# Patient Record
Sex: Female | Born: 1975 | Race: White | Hispanic: No | Marital: Married | Smoking: Former smoker
Health system: Southern US, Community
[De-identification: ages and names within clinical notes are randomized; demographics above are authoritative.]

## PROBLEM LIST (undated history)

## (undated) HISTORY — PX: OTHER SURGICAL HISTORY: SHX169

---

## 2013-02-08 ENCOUNTER — Emergency Department (HOSPITAL_COMMUNITY)
Admission: EM | Admit: 2013-02-08 | Discharge: 2013-02-08 | Disposition: A | Discharge status: Nursing Home (Includes ICF) | Payer: 59 | Source: Home / Self Care | Attending: Family Medicine | Admitting: Family Medicine

## 2013-02-08 ENCOUNTER — Emergency Department (HOSPITAL_COMMUNITY): Payer: 59

## 2013-02-08 ENCOUNTER — Encounter (HOSPITAL_COMMUNITY): Payer: Self-pay | Admitting: Vascular Surgery

## 2013-02-08 ENCOUNTER — Emergency Department (HOSPITAL_COMMUNITY)
Admission: EM | Admit: 2013-02-08 | Discharge: 2013-02-08 | Disposition: A | Discharge status: Home / Self Care | Payer: 59 | Attending: Emergency Medicine | Admitting: Emergency Medicine

## 2013-02-08 ENCOUNTER — Encounter (HOSPITAL_COMMUNITY): Payer: Self-pay | Admitting: Emergency Medicine

## 2013-02-08 DIAGNOSIS — Z79899 Other long term (current) drug therapy: Secondary | ICD-10-CM | POA: Insufficient documentation

## 2013-02-08 DIAGNOSIS — R0789 Other chest pain: Secondary | ICD-10-CM | POA: Insufficient documentation

## 2013-02-08 DIAGNOSIS — R079 Chest pain, unspecified: Secondary | ICD-10-CM

## 2013-02-08 DIAGNOSIS — R209 Unspecified disturbances of skin sensation: Secondary | ICD-10-CM | POA: Insufficient documentation

## 2013-02-08 DIAGNOSIS — Z87891 Personal history of nicotine dependence: Secondary | ICD-10-CM | POA: Insufficient documentation

## 2013-02-08 DIAGNOSIS — Z7982 Long term (current) use of aspirin: Secondary | ICD-10-CM | POA: Insufficient documentation

## 2013-02-08 LAB — POCT I-STAT TROPONIN I: Troponin i, poc: 0 ng/mL (ref 0.00–0.08)

## 2013-02-08 LAB — CBC WITH DIFFERENTIAL/PLATELET
Basophils Absolute: 0 10*3/uL (ref 0.0–0.1)
HCT: 35.7 % — ABNORMAL LOW (ref 36.0–46.0)
Lymphocytes Relative: 24 % (ref 12–46)
Monocytes Absolute: 0.4 10*3/uL (ref 0.1–1.0)
Neutro Abs: 4 10*3/uL (ref 1.7–7.7)
Platelets: 165 10*3/uL (ref 150–400)
RBC: 3.91 MIL/uL (ref 3.87–5.11)
RDW: 12.7 % (ref 11.5–15.5)
WBC: 5.9 10*3/uL (ref 4.0–10.5)

## 2013-02-08 LAB — BASIC METABOLIC PANEL
CO2: 23 mEq/L (ref 19–32)
Chloride: 102 mEq/L (ref 96–112)
Sodium: 137 mEq/L (ref 135–145)

## 2013-02-08 MED ORDER — GI COCKTAIL ~~LOC~~
30.0000 mL | Freq: Once | ORAL | Status: AC
  Administered 2013-02-08: 30 mL via ORAL
  Filled 2013-02-08: qty 30

## 2013-02-08 NOTE — ED Notes (Signed)
C/o chest pain and sob.  Onset of chest pain and sob this am, noted as soon as she woke, and continued when she got up.  Points to center chest as location of pain.  Reports pain as heavy and tight.  No nausea, no vomiting.  Pain in chest is constant and worse with deep inspiration.  Right arm numbness comes and goes.  Ibuprofen helped , but not complete relief.  Feels "sob" is more of a feeling she cannot get a deep breath.

## 2013-02-08 NOTE — ED Notes (Signed)
Pt reports to the ED for eval of midsternal chest heaviness. Pt reports some associated right arm numbness. Pt also SOB. Pt denies any N/V, diaphoresis, lightheadedness, or dizziness. Pt reports the pain woke her up from sleeping. Pt reports she has had a lot of stress in her life but is unsure if it is related. Pt from [facility]. Pt in NSR at this time and speaking in full sentences. Pt A&O x4. Pt denies any cough, fevers, or chills. Pt reports tightness and tension in her neck x 1 week.

## 2013-02-08 NOTE — ED Provider Notes (Signed)
CSN: [id number]     Arrival date & time 02/08/13  1543 History   First MD Initiated Contact with Patient 02/08/13 1550     Chief Complaint  Patient presents with  . Chest Pain   (Consider location/radiation/quality/duration/timing/severity/associated sxs/prior Treatment) HPI Comments: Desiree Kelly is a 37 y.o. female who presents for evaluation of chest heaviness that started today. She also has intermittent numbness in the right arm. She has been treating herself for "GERD" for several weeks. Today, she took an extra Prevacid tablet, without relief of the discomfort. She denies associated weakness, dizziness, nausea, vomiting, shortness of breath, back pain or difficulty walking. There's been no anorexia. She has not had this problem previously. She denies stress or depression. There are no other known modifying factors.   Patient is a 37 y.o. female presenting with chest pain. The history is provided by the patient.  Chest Pain   History reviewed. No pertinent past medical history. Past Surgical History  Procedure Laterality Date  . Tubes tied     Family History  Problem Relation Age of Onset  . Heart attack Mother    History  Substance Use Topics  . Smoking status: Former Games developer  . Smokeless tobacco: Not on file  . Alcohol Use: No   OB History   Grav Para Term Preterm Abortions TAB SAB Ect Mult Living                 Review of Systems  Cardiovascular: Positive for chest pain.  All other systems reviewed and are negative.    Allergies  Review of patient's allergies indicates no known allergies.  Home Medications   Current Outpatient Rx  Name  Route  Sig  Dispense  Refill  . aspirin 81 MG tablet   Oral   Take 162 mg by mouth once.          Marland Kitchen aspirin EC 325 MG tablet   Oral   Take 325 mg by mouth once.         . Biotin 1000 MCG tablet   Oral   Take 1,000 mcg by mouth daily.         . cetirizine (ZYRTEC) 10 MG tablet   Oral   Take 10 mg by  mouth daily.         . lansoprazole (PREVACID) 15 MG capsule   Oral   Take 15 mg by mouth daily.         Marland Kitchen loratadine (CLARITIN) 10 MG tablet   Oral   Take 10 mg by mouth once as needed for allergies. For allergy symptoms         . Prenatal Vit-Fe Fumarate-FA (PRENATAL PO)   Oral   Take 1 tablet by mouth daily.           BP 113/77  Pulse 74  Temp(Src) 98.1 F (36.7 C) (Oral)  Resp 15  SpO2 100%  LMP 01/15/2013 Physical Exam  Nursing note and vitals reviewed. Constitutional: She is oriented to person, place, and time. She appears well-developed and well-nourished.  HENT:  Head: Normocephalic and atraumatic.  Eyes: Conjunctivae and EOM are normal. Pupils are equal, round, and reactive to light.  Neck: Normal range of motion and phonation normal. Neck supple.  Cardiovascular: Normal rate, regular rhythm and intact distal pulses.   Pulmonary/Chest: Effort normal and breath sounds normal. She has no wheezes. She has no rales. She exhibits no tenderness.  Abdominal: Soft. She exhibits no distension. There is no  tenderness. There is no guarding.  Musculoskeletal: Normal range of motion.  Neurological: She is alert and oriented to person, place, and time. She has normal strength. She exhibits normal muscle tone.  Skin: Skin is warm and dry.  Psychiatric: She has a normal mood and affect. Her behavior is normal. Judgment and thought content normal.    ED Course  Procedures (including critical care time)  Medications  gi cocktail (Maalox,Lidocaine,Donnatal) (30 mLs Oral Given 02/08/13 1745)    Patient Vitals for the past 24 hrs:  BP Temp Temp src Pulse Resp SpO2  02/08/13 1830 113/77 mmHg - - 74 15 100 %  02/08/13 1747 123/85 mmHg - - 75 13 100 %  02/08/13 1630 - - - 70 15 100 %  02/08/13 1601 131/75 mmHg 98.1 F (36.7 C) Oral 83 18 -  02/08/13 1548 132/88 mmHg 97.8 F (36.6 C) Oral 78 16 100 %    6:55 PM Reevaluation with update and discussion. After initial  assessment and treatment, an updated evaluation reveals she reports transient improvement with the GI cocktail, and otherwise has no further complaints. [name]   Labs Review Labs Reviewed  CBC WITH DIFFERENTIAL - Abnormal; Notable for the following:    HCT 35.7 (*)    All other components within normal limits  BASIC METABOLIC PANEL  POCT I-STAT TROPONIN I   Imaging Review Dg Chest 2 View  02/08/2013   *RADIOLOGY REPORT*  Clinical Data: Chest pain  CHEST - 2 VIEW  Comparison: None.  Findings: Cardiomediastinal silhouette appears normal.  No acute pulmonary disease is noted.  Bony thorax is intact.  IMPRESSION: No acute cardiopulmonary abnormality seen.   Original Report Authenticated By: Lupita Raider.,  M.D.    MDM   1. Chest pain    Nonspecific chest discomfort, with negative EGD evaluation. She is PERC negative. Doubt ACS, PE, or pneumonia. She is stable for discharge to outpatient management.  Nursing Notes Reviewed/ Care Coordinated, and agree without changes. Applicable Imaging Reviewed.  Interpretation of Laboratory Data incorporated into ED treatment   Plan: Home Medications- PPI regularly; Home Treatments and Observation- Rest; return here if the recommended treatment, does not improve the symptoms; Recommended follow up- PCP of choice 2-3 weeks      Flint Melter, MD 02/08/13 [phone]

## 2013-02-08 NOTE — ED Provider Notes (Signed)
CSN: [id number]     Arrival date & time 02/08/13  1423 History   First MD Initiated Contact with Patient 02/08/13 1458     Chief Complaint  Patient presents with  . Chest Pain   (Consider location/radiation/quality/duration/timing/severity/associated sxs/prior Treatment) HPI Comments: Former smoker with substernal chest pain since she woke up this morning, exacerbated by movement with significant family history (mother passed away at age 39 with MI.)  Patient is a 37 y.o. female presenting with chest pain. The history is provided by the patient.  Chest Pain Pain location:  Substernal area Pain quality: dull and pressure   Radiates to: Does not radiate, but feels "tension" in neck. Pain radiates to the back: no (Felt a dull pain while sitting up in middle of back earlier today)   Pain severity:  Moderate Onset quality:  Sudden (Started when she woke up this morning) Duration:  10 hours Timing:  Constant Progression:  Waxing and waning (Worse with deep breath) Chronicity:  New (Had GERD 3 weeks ago, but this feels different) Context: breathing and movement (when walking quickly)   Relieved by:  Nothing Worsened by:  Deep breathing Ineffective treatments:  Aspirin Associated symptoms: back pain, numbness (Right arm intermittently) and shortness of breath   Associated symptoms: no abdominal pain, no cough, no fatigue, no fever, no nausea and no syncope   Risk factors: no birth control, no diabetes mellitus, no high cholesterol, no hypertension, not pregnant, no prior DVT/PE and no smoking     History reviewed. No pertinent past medical history. Past Surgical History  Procedure Laterality Date  . Tubes tied     Family History  Problem Relation Age of Onset  . Heart attack Mother    History  Substance Use Topics  . Smoking status: Former Games developer  . Smokeless tobacco: Not on file  . Alcohol Use: No   OB History   Grav Para Term Preterm Abortions TAB SAB Ect Mult Living                  Review of Systems  Constitutional: Negative for fever and fatigue.  HENT: Negative for congestion.   Eyes: Negative for visual disturbance.  Respiratory: Positive for shortness of breath. Negative for cough and wheezing.   Cardiovascular: Positive for chest pain and leg swelling (Slightly worse for the last 1 week). Negative for syncope.  Gastrointestinal: Negative for nausea and abdominal pain.  Genitourinary: Negative for dysuria.  Musculoskeletal: Positive for back pain.  Skin: Negative for rash.  Neurological: Positive for numbness (Right arm intermittently). Negative for light-headedness.    Allergies  Review of patient's allergies indicates no known allergies.  Home Medications   Current Outpatient Rx  Name  Route  Sig  Dispense  Refill  . aspirin 81 MG tablet   Oral   Take 81 mg by mouth daily. Took 2 tabs of 81mg  aspirin this am, then reports taking 325mg  aspirin at noon.         Marland Kitchen BIOTIN PO   Oral   Take by mouth.         . Cetirizine HCl (ZYRTEC PO)   Oral   Take by mouth.         Marland Kitchen ibuprofen (ADVIL,MOTRIN) 800 MG tablet   Oral   Take 800 mg by mouth every 8 (eight) hours as needed for pain.         . Prenatal Vit-Fe Fumarate-FA (PRENATAL PO)   Oral   Take by mouth.  BP 136/70  Pulse 74  Temp(Src) 98 F (36.7 C) (Oral)  Resp 16  SpO2 100%  LMP 01/15/2013 Physical Exam  Constitutional: She is oriented to person, place, and time. She appears well-developed and well-nourished. No distress.  HENT:  Head: Normocephalic and atraumatic.  Mouth/Throat: Oropharynx is clear and moist.  Eyes: Pupils are equal, round, and reactive to light.  Neck: Normal range of motion.  Cardiovascular: Normal rate, regular rhythm and normal heart sounds.   Pulmonary/Chest: Breath sounds normal.  Deep inspiration limited by chest discomfort  Abdominal: Soft. There is no tenderness.  Musculoskeletal: Normal range of motion. She exhibits no edema.   Lymphadenopathy:    She has no cervical adenopathy.  Neurological: She is alert and oriented to person, place, and time.  Skin: Skin is warm and dry. No rash noted. She is not diaphoretic.   ED Course  Procedures (including critical care time) Labs Review Labs Reviewed - No data to display Imaging Review No results found.  EKG: there are no previous tracings available for comparison, normal rate, normal intervals, normal axis. Inverted T waves in III, AvF, V1-V3.  MDM   1. Chest pain    Patient with moderately suspicious story (substernal pain exacerbated by movement and deep inspiration) and given patient's history of smoking as well as family history of heart disease, will transfer to [facility] ED for further work up and evaluation. Patient updated on plan and agrees with transfer.   Hilarie Fredrickson, MD 02/08/13 1535

## 2013-02-09 NOTE — ED Provider Notes (Signed)
Medical screening examination/treatment/procedure(s) were performed by a resident physician or non-physician practitioner and as the supervising physician I was immediately available for consultation/collaboration.  Jahfari Ambers, MD    Lorren Splawn S Samhita Kretsch, MD 02/09/13 0846 

## 2013-12-30 ENCOUNTER — Ambulatory Visit (INDEPENDENT_AMBULATORY_CARE_PROVIDER_SITE_OTHER): Payer: 59 | Admitting: Podiatrist

## 2013-12-30 ENCOUNTER — Ambulatory Visit (INDEPENDENT_AMBULATORY_CARE_PROVIDER_SITE_OTHER): Payer: 59

## 2013-12-30 ENCOUNTER — Encounter: Payer: Self-pay | Admitting: Podiatrist

## 2013-12-30 VITALS — BP 127/68 | HR 74 | Resp 18

## 2013-12-30 DIAGNOSIS — M722 Plantar fascial fibromatosis: Secondary | ICD-10-CM

## 2013-12-30 MED ORDER — TRIAMCINOLONE ACETONIDE 10 MG/ML IJ SUSP
10.0000 mg | Freq: Once | INTRAMUSCULAR | Status: AC
  Administered 2013-12-30: 10 mg

## 2013-12-30 NOTE — Patient Instructions (Signed)

## 2013-12-30 NOTE — Progress Notes (Signed)
   Subjective:    Patient ID: Desiree Kelly, female    DOB: [birth year], 38 y.o.   MRN: [id number]  HPI I AM HAVING SOME HEEL PAIN ON MY LEFT FOOT AND HAS BEEN GOING ON FOR ABOUT 2 MONTHS AND HURTS WHEN I SIT OR GET UP AND IN THE MORNING AND HURTS TO WALK ON IT AND HAS A DULL ACHE AND BURNS AND THROBS AND A STABBING FEELING    Review of Systems  All other systems reviewed and are negative.      Objective:   Physical Exam  Patient is awake, alert, and oriented x 3.  In no acute distress.  Vascular status is intact with palpable pedal pulses at 2/4 DP and PT bilateral and capillary refill time within normal limits. Neurological sensation is also intact bilaterally via Semmes Weinstein monofilament at 5/5 sites. Light touch, vibratory sensation, Achilles tendon reflex is intact. Dermatological exam reveals skin color, turger and texture as normal. No open lesions present.  Musculature intact with dorsiflexion, plantarflexion, inversion, eversion.  Pain on palpation on the left foot is noted at the insertion of the plantar fascial band and central aspect of the left heel.  Mildly high arched foot is present.     Assessment & Plan:  Plantar fasciitis left  Plan:  Injected the plantar fascia medially under sterile technique.  A removable strapping was dispensed.  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

## 2014-10-16 IMAGING — CR DG CHEST 2V
2 series · 2 of 2 positions shown · non-contrast
Comparison: None.

CLINICAL DATA: Chest pain

CHEST - 2 VIEW

[w chest pa]
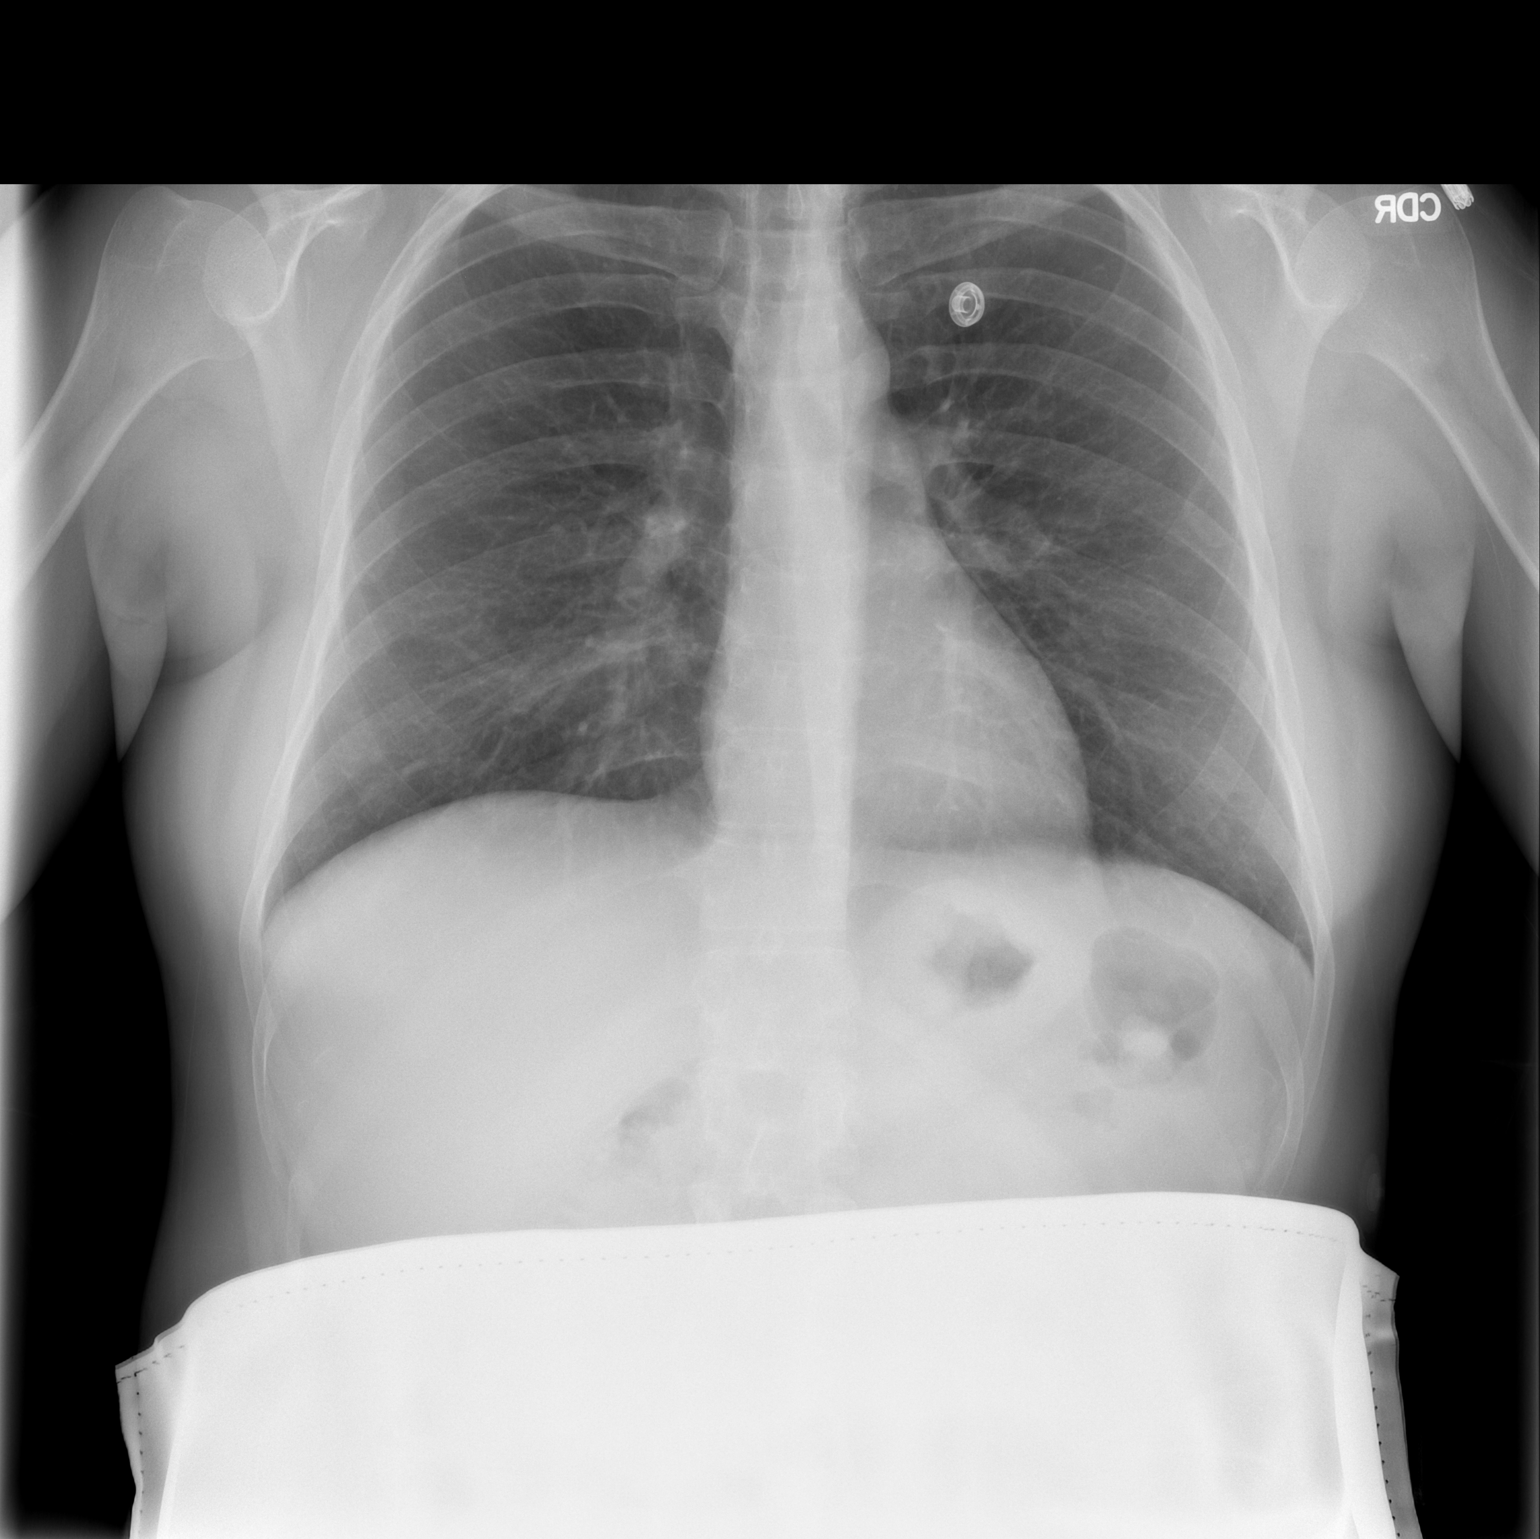

[w chest lat]
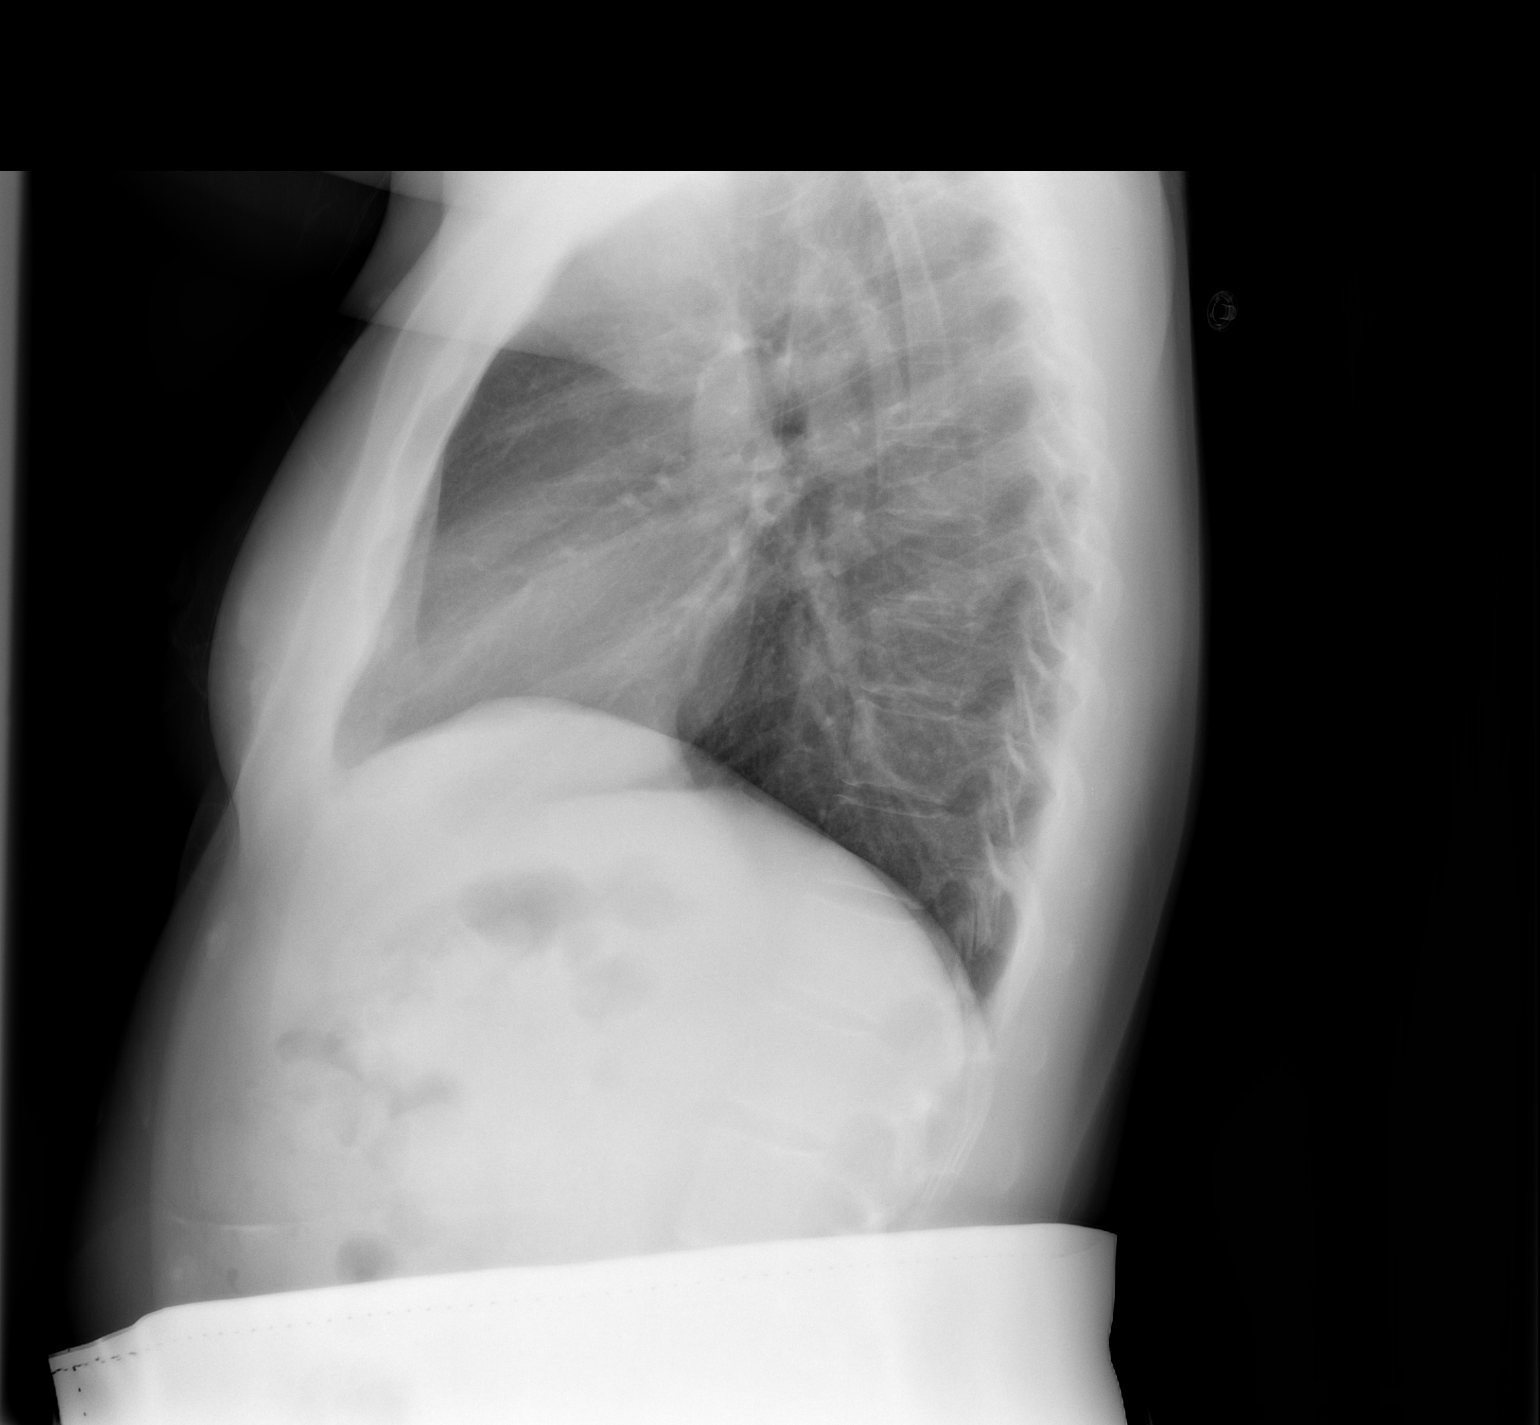

[2 of 2 positions shown; findings below may reference images not displayed]

FINDINGS: Cardiomediastinal silhouette appears normal.  No acute
pulmonary disease is noted.  Bony thorax is intact.
IMPRESSION: No acute cardiopulmonary abnormality seen.
